# Patient Record
Sex: Male | Born: 1990 | Race: White | Hispanic: No | Marital: Single
Health system: Southern US, Community
[De-identification: ages and names within clinical notes are randomized; demographics above are authoritative.]

---
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
## 2010-01-03 ENCOUNTER — Emergency Department (HOSPITAL_COMMUNITY): Admission: EM | Admit: 2010-01-03 | Discharge: 2010-01-03 | Payer: Self-pay | Admitting: Emergency Medicine

## 2010-03-04 ENCOUNTER — Encounter: Payer: Self-pay | Admitting: Specialist

## 2010-03-17 ENCOUNTER — Encounter: Payer: Self-pay | Admitting: Specialist

## 2010-05-18 LAB — URINALYSIS, ROUTINE W REFLEX MICROSCOPIC
Bilirubin Urine: NEGATIVE
Glucose, UA: NEGATIVE mg/dL
Ketones, ur: NEGATIVE mg/dL
Nitrite: NEGATIVE
Protein, ur: NEGATIVE mg/dL

## 2010-05-19 LAB — COMPREHENSIVE METABOLIC PANEL
AST: 67 U/L — ABNORMAL HIGH (ref 0–37)
BUN: 15 mg/dL (ref 6–23)
CO2: 28 mEq/L (ref 19–32)
Calcium: 9.6 mg/dL (ref 8.4–10.5)
Creatinine, Ser: 1.05 mg/dL (ref 0.4–1.5)
GFR calc Af Amer: >60 mL/min (ref >60)
GFR calc non Af Amer: >60 mL/min (ref >60)
Total Bilirubin: 1 mg/dL (ref 0.3–1.2)

## 2010-05-19 LAB — URINALYSIS, ROUTINE W REFLEX MICROSCOPIC
Glucose, UA: NEGATIVE mg/dL
Nitrite: NEGATIVE
Specific Gravity, Urine: 1.023 (ref 1.005–1.030)
pH: 6 (ref 5.0–8.0)

## 2010-05-19 LAB — CBC
HCT: 48.9 % (ref 39.0–52.0)
MCHC: 34.1 g/dL (ref 30.0–36.0)
MCV: 96.3 fL (ref 78.0–100.0)
RBC: 5.08 MIL/uL (ref 4.22–5.81)

## 2010-05-19 LAB — DIFFERENTIAL
Basophils Absolute: 0 10*3/uL (ref 0.0–0.1)
Eosinophils Relative: 1 % (ref 0–5)
Lymphocytes Relative: 23 % (ref 12–46)
Lymphs Abs: 1.5 10*3/uL (ref 0.7–4.0)
Neutro Abs: 4.3 10*3/uL (ref 1.7–7.7)
Neutrophils Relative %: 66 % (ref 43–77)

## 2010-11-28 IMAGING — US US ABDOMEN COMPLETE
1 series · 14 of 25 positions shown · non-contrast
Comparison: CT dated 01/17/2005.

CLINICAL DATA: Abdominal pain.

ABDOMINAL ULTRASOUND COMPLETE

[Series 1: us abdomen complete · 0.30mm/px · 14 of 54 slices shown]
[im 1/54]
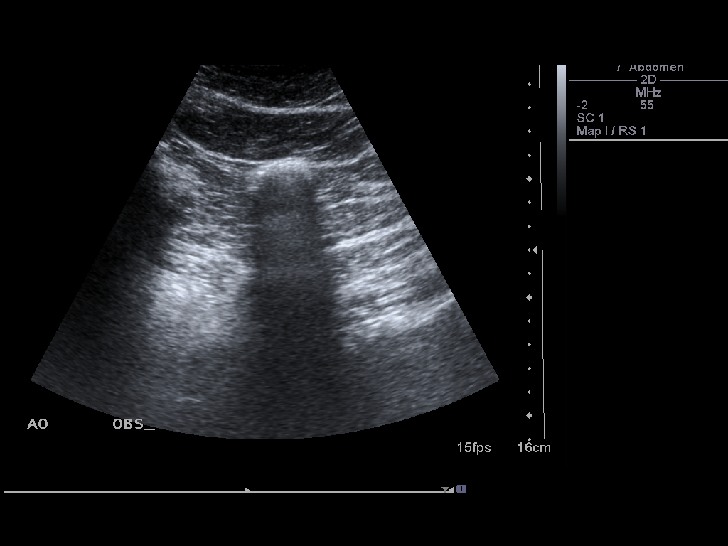
[im 5/54]
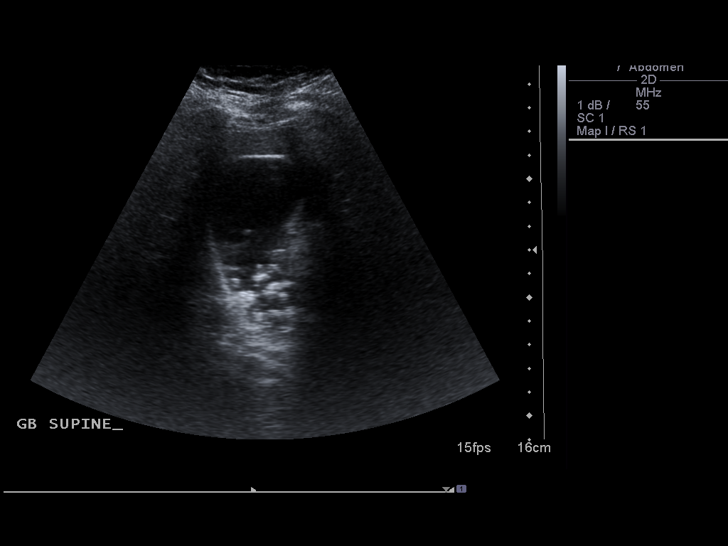
[im 9/54]
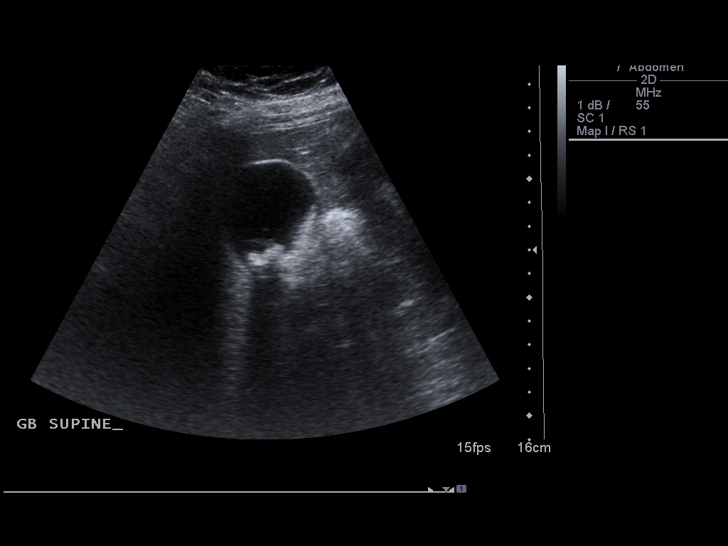
[im 14/54]
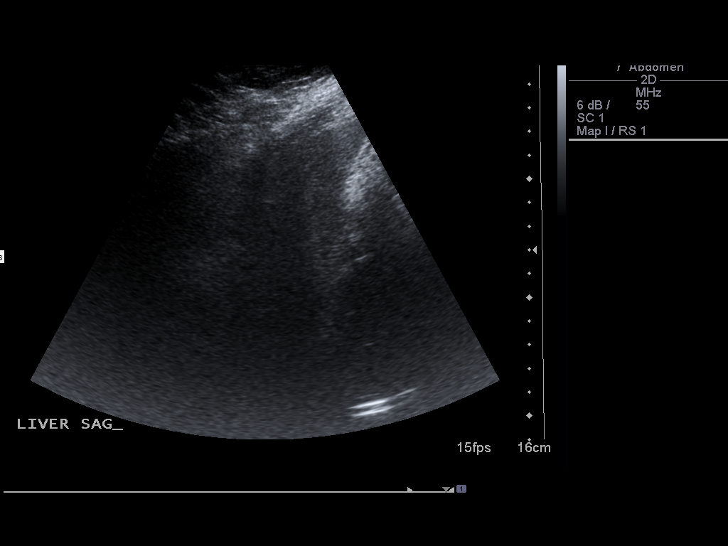
[im 18/54]
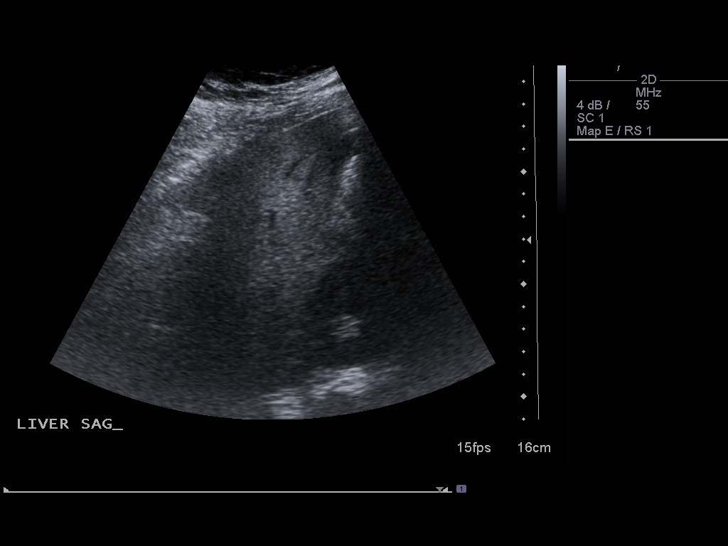
[im 20/54]
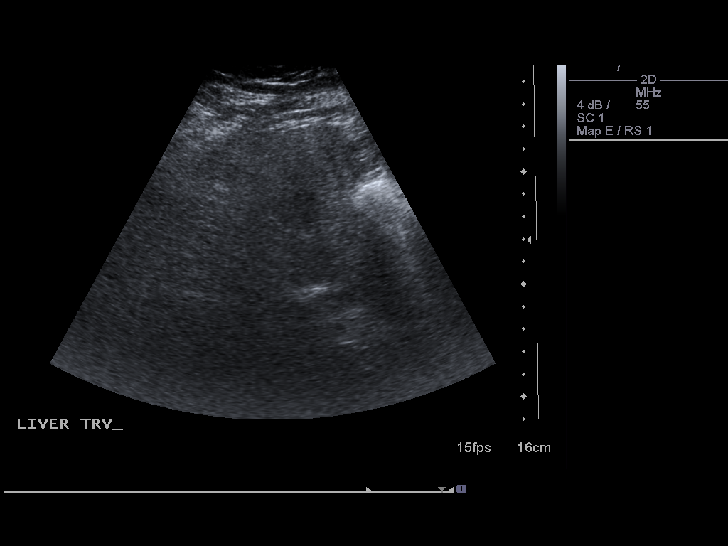
[im 25/54]
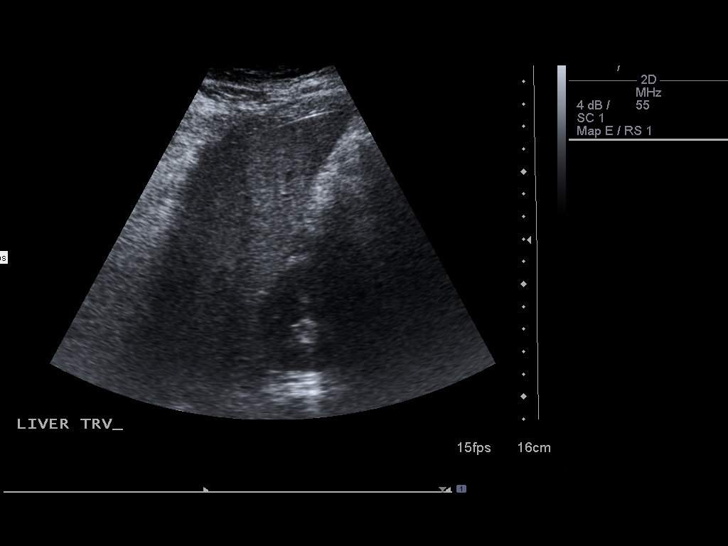
[im 29/54]
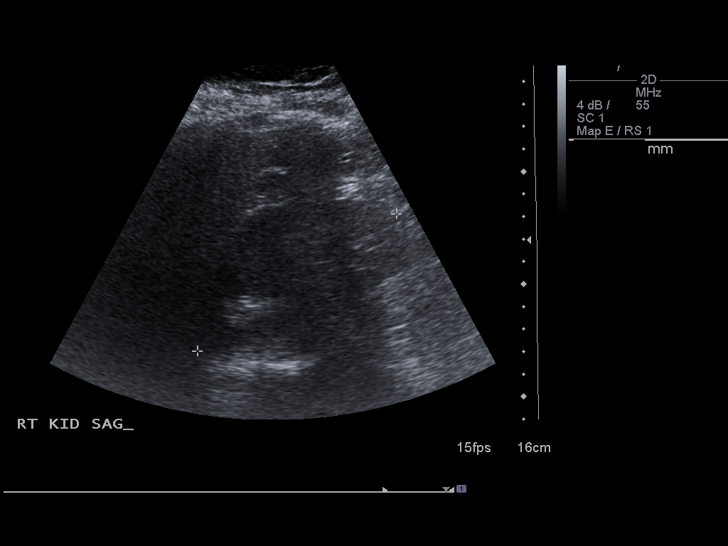
[im 34/54]
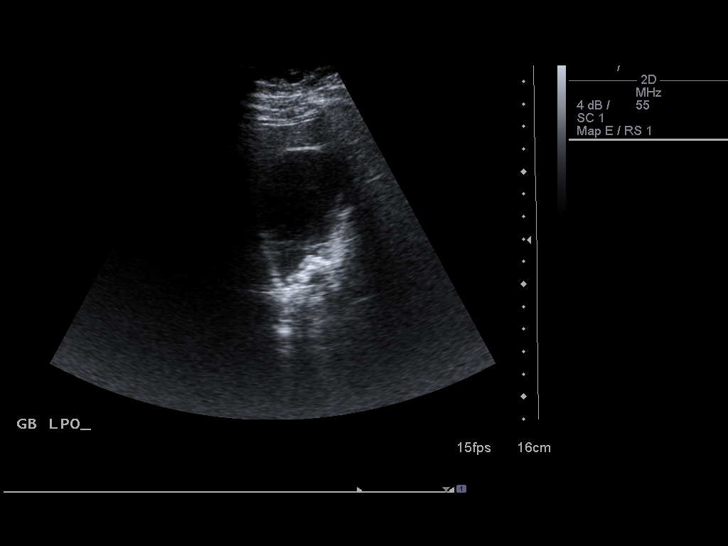
[im 36/54]
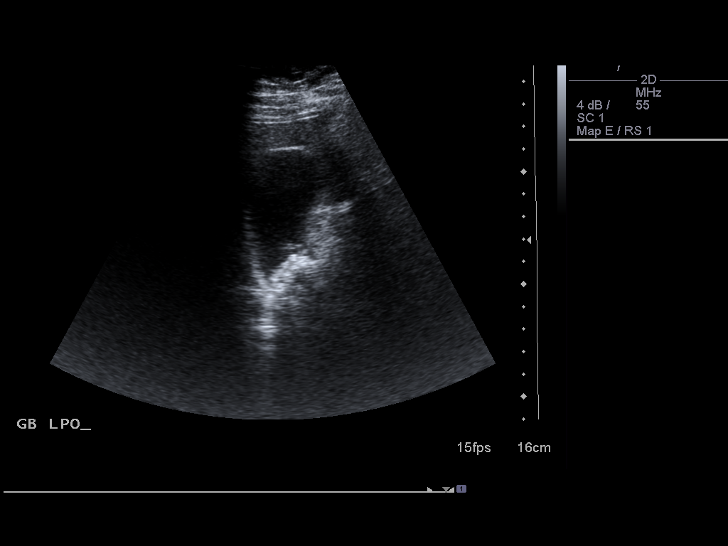
[im 40/54]
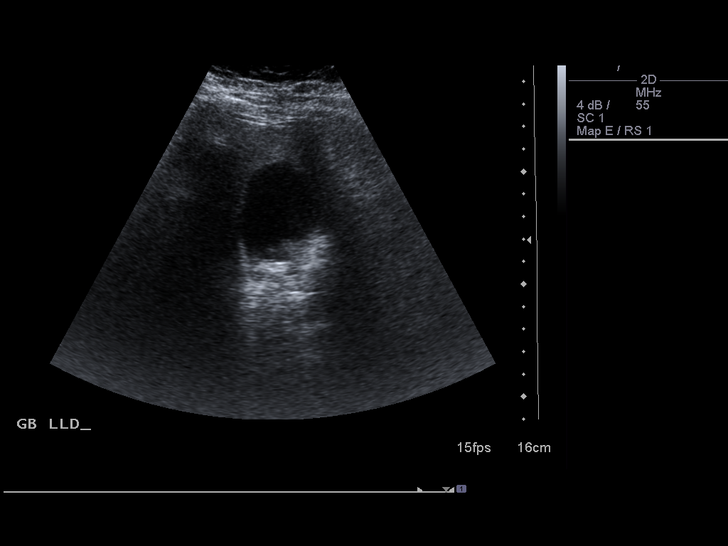
[im 45/54]
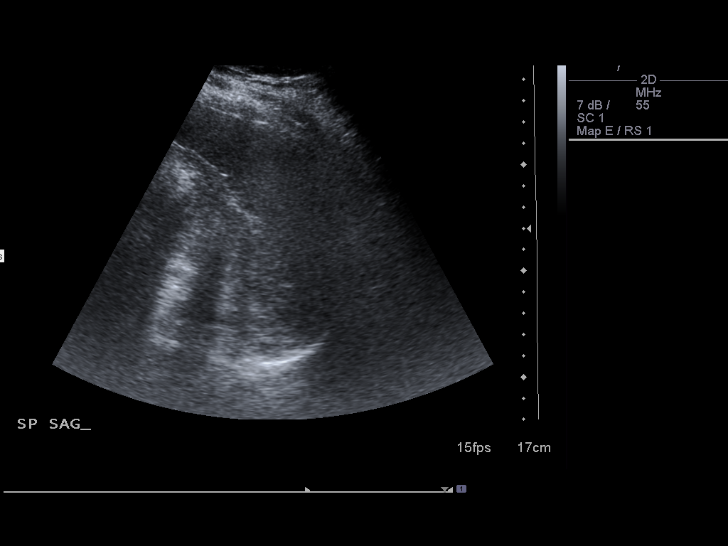
[im 49/54]
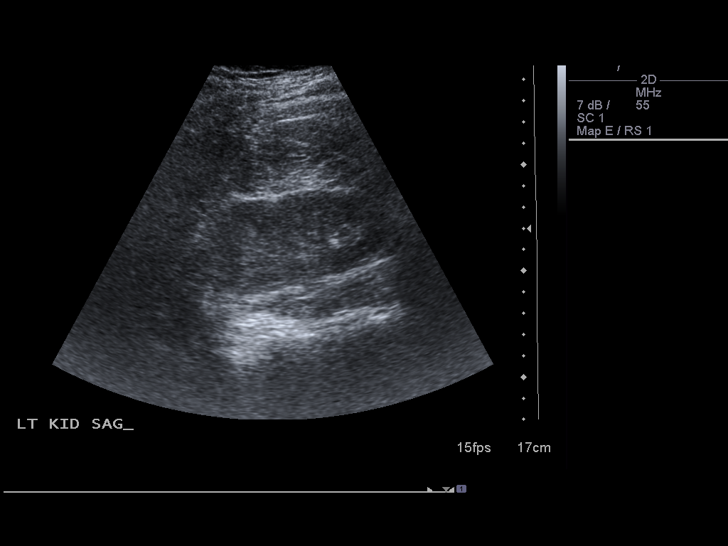
[im 54/54]
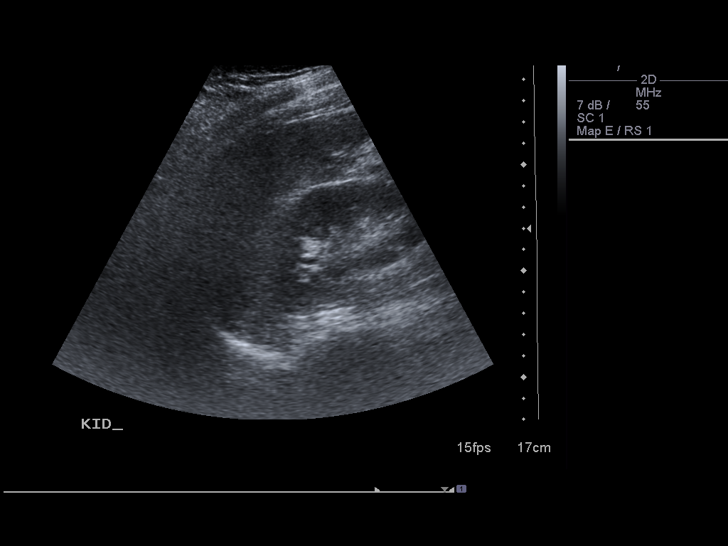

[14 of 25 positions shown; findings below may reference images not displayed]

FINDINGS: Gallbladder:  Multiple gallstones, the largest measuring 1.1 cm in
maximum diameter.  No gallbladder wall thickening or
pericholecystic fluid.  The patient was not tender over the
gallbladder.

Common bile duct: Normal diameter, measuring 3.8mm.

Liver:  Diffusely echogenic, corresponding to fatty infiltration on
the previous CT.

Inferior vena cava:  The visualized portion appears normal.

Pancreas:  Not visualized due to overlying bowel gas.

Spleen:  Normal.

Right kidney:  Normal.

Left kidney:  Normal.

Abdominal aorta:  Not visualized proximally due to overlying bowel
gas.  The visualized portions are normal in caliber without
aneurysm.

Other:  No free peritoneal fluid.
IMPRESSION: 1.  Cholelithiasis without evidence of cholecystitis.
2.  Diffuse fatty infiltration of the liver.
3.  Non-visualized pancreas and proximal abdominal aorta due to
overlying bowel gas.

## 2011-02-11 ENCOUNTER — Inpatient Hospital Stay: Payer: Self-pay | Admitting: Internal Medicine

## 2012-02-25 ENCOUNTER — Emergency Department: Payer: Self-pay | Admitting: Emergency Medicine

## 2012-07-07 ENCOUNTER — Emergency Department: Payer: Self-pay | Admitting: Emergency Medicine

## 2012-07-07 LAB — COMPREHENSIVE METABOLIC PANEL
Albumin: 4 g/dL (ref 3.4–5.0)
Alkaline Phosphatase: 85 U/L (ref 50–136)
Calcium, Total: 9 mg/dL (ref 8.5–10.1)
EGFR (African American): >60
Osmolality: 277 (ref 275–301)
SGPT (ALT): 137 U/L — ABNORMAL HIGH (ref 12–78)
Sodium: 139 mmol/L (ref 136–145)
Total Protein: 7.8 g/dL (ref 6.4–8.2)

## 2012-07-07 LAB — URINALYSIS, COMPLETE
Bacteria: NONE SEEN
Bilirubin,UR: NEGATIVE
Glucose,UR: NEGATIVE mg/dL (ref 0–75)
Ketone: NEGATIVE
Nitrite: NEGATIVE
Protein: 30
RBC,UR: 4 /HPF (ref 0–5)
Squamous Epithelial: NONE SEEN

## 2012-07-08 LAB — CBC
MCH: 33.1 pg (ref 26.0–34.0)
MCHC: 35.5 g/dL (ref 32.0–36.0)
Platelet: 254 10*3/uL (ref 150–440)
RDW: 13.9 % (ref 11.5–14.5)
WBC: 9.2 10*3/uL (ref 3.8–10.6)

## 2013-04-02 IMAGING — CT CT CHEST W/ CM
1 series · 15 of 31 positions shown, 19 images · IV contrast (APPLIED)
Comparison: none

REASON FOR EXAM: dyspnea  PO2 56, O2 stat 86%    [HOSPITAL]
COMMENTS:   LMP: (Male)

PROCEDURE:     CT  - CT CHEST (FOR PE) W  - February 11, 2011  [DATE]
RESULT:
TECHNIQUE: Helical 3 mm sections were obtained from the thoracic inlet
through the lung bases status post intravenous administration of 100 mL of
[id number].

[Series 4: soft tissue · axial · 0.80mm/px · z∈[+77,+305]mm · 15 of 84 slices shown, 19 images]
[im 4/84  mediastinal]
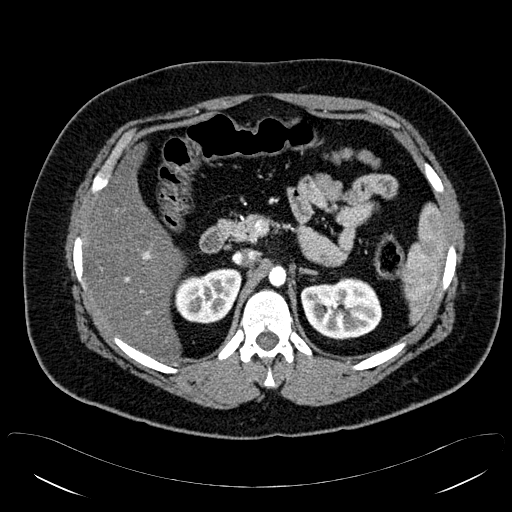
[im 4/84  lung]
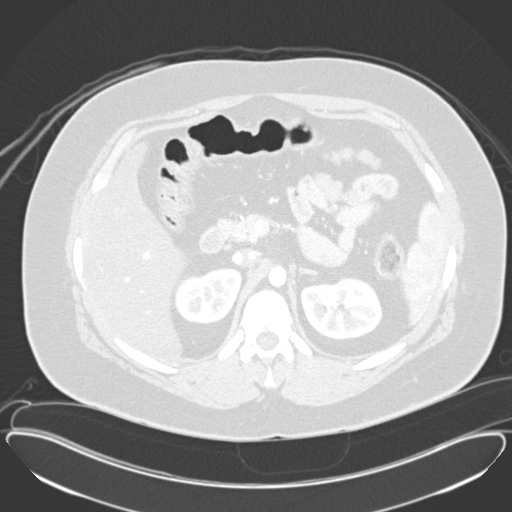
[im 10/84  lung]
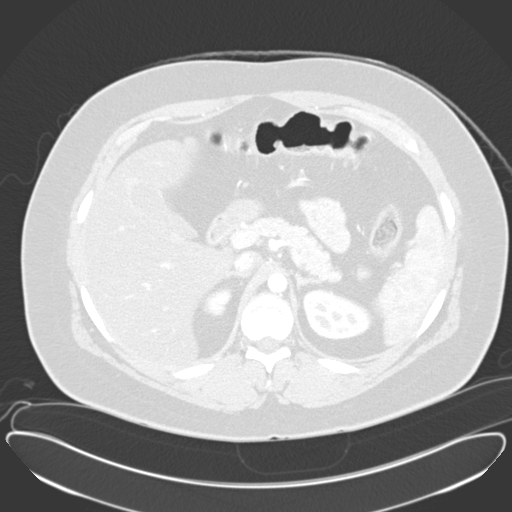
[im 16/84  lung]
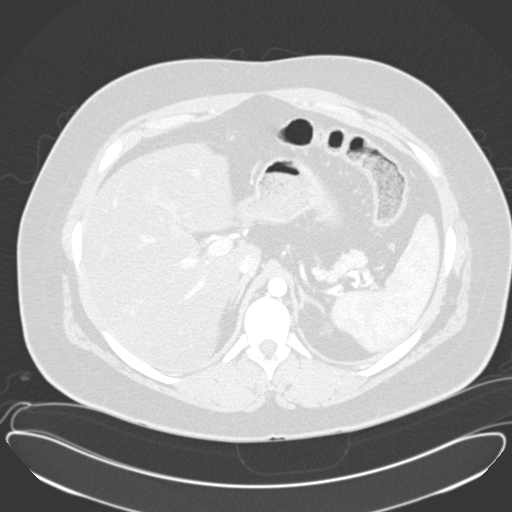
[im 19/84  lung]
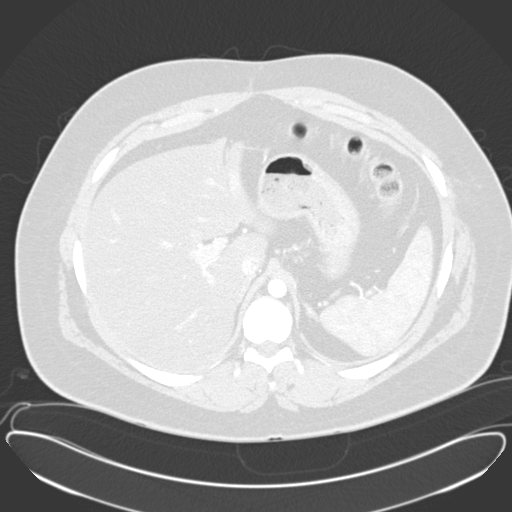
[im 25/84  mediastinal]
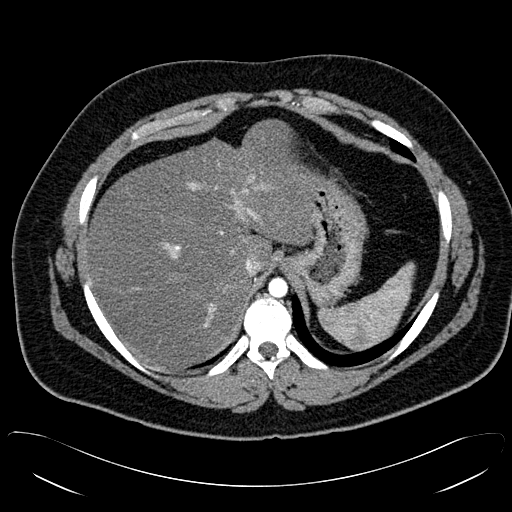
[im 25/84  lung]
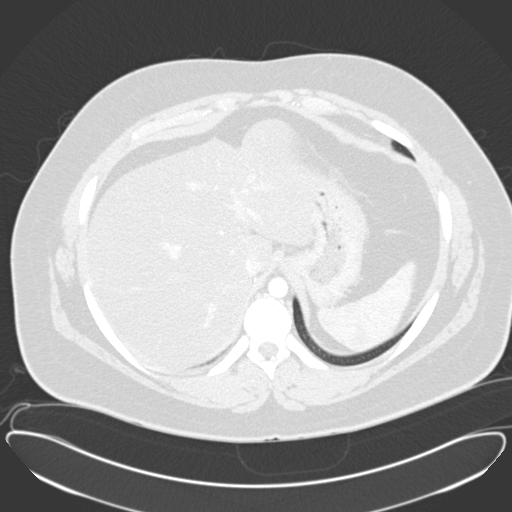
[im 31/84  lung]
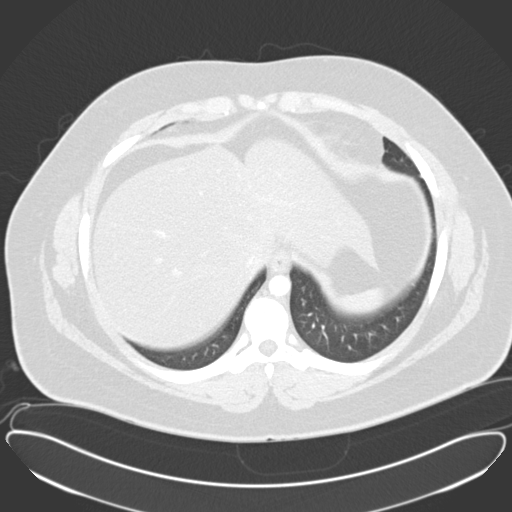
[im 37/84  lung]
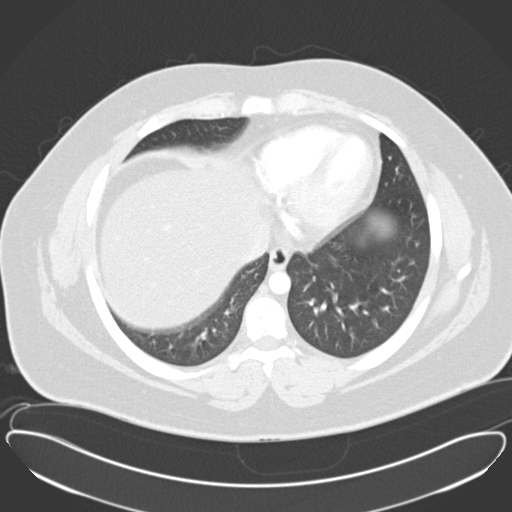
[im 44/84  lung]
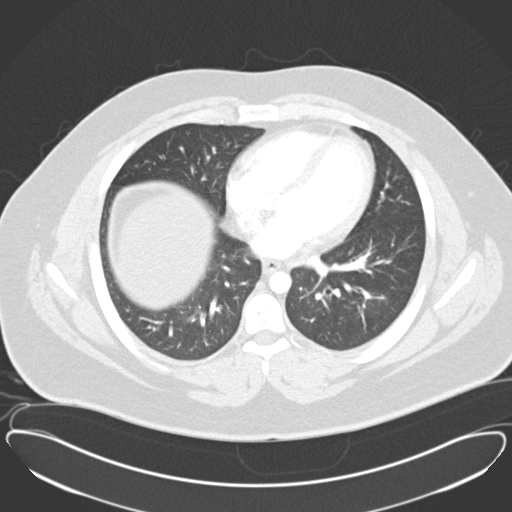
[im 47/84  mediastinal]
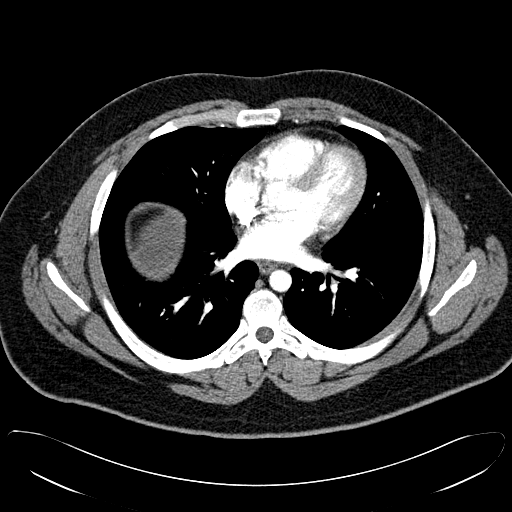
[im 47/84  lung]
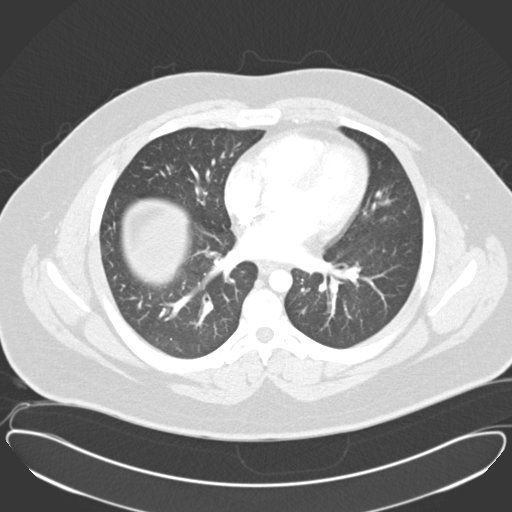
[im 53/84  lung]
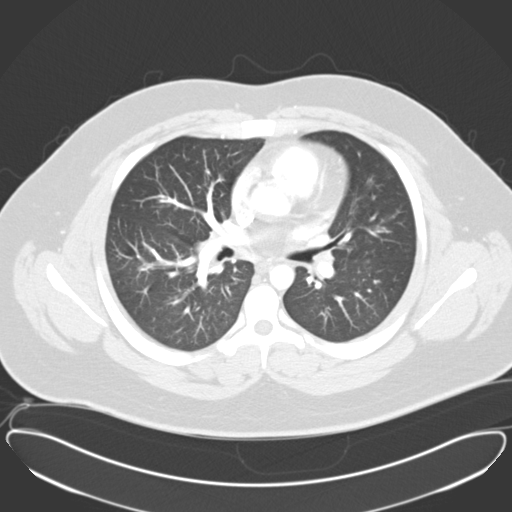
[im 59/84  lung]
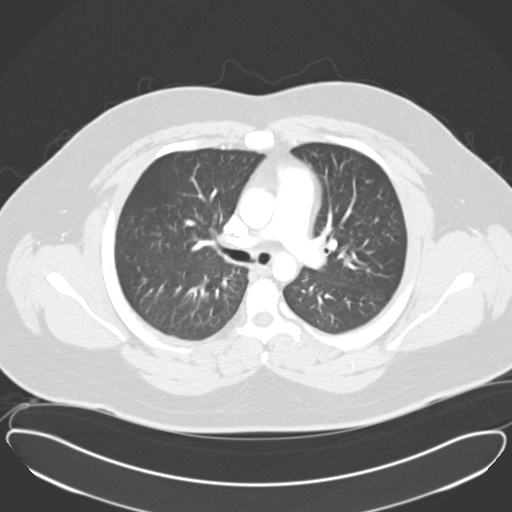
[im 65/84  lung]
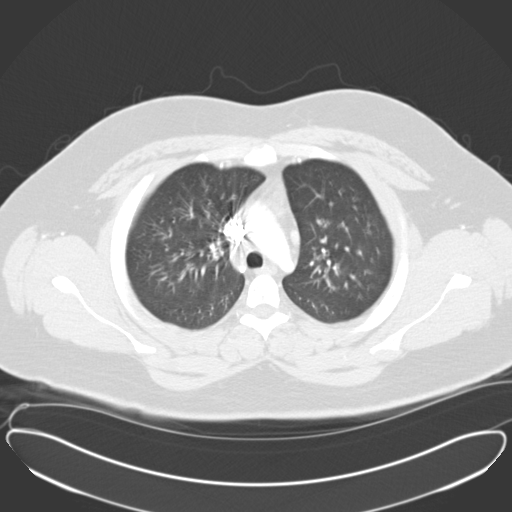
[im 68/84  mediastinal]
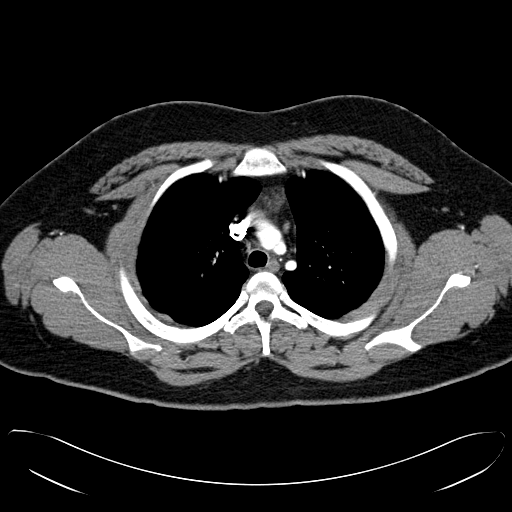
[im 68/84  lung]
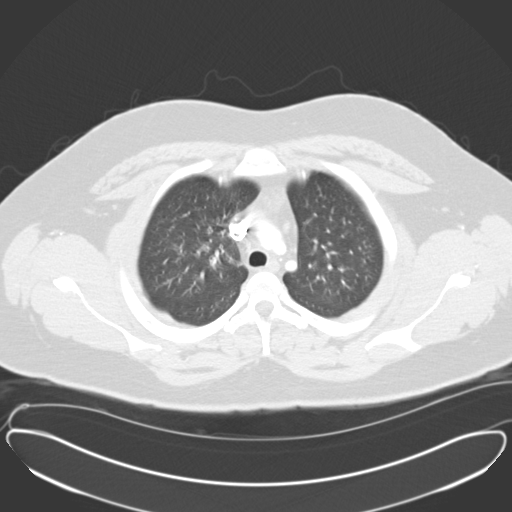
[im 74/84  lung]
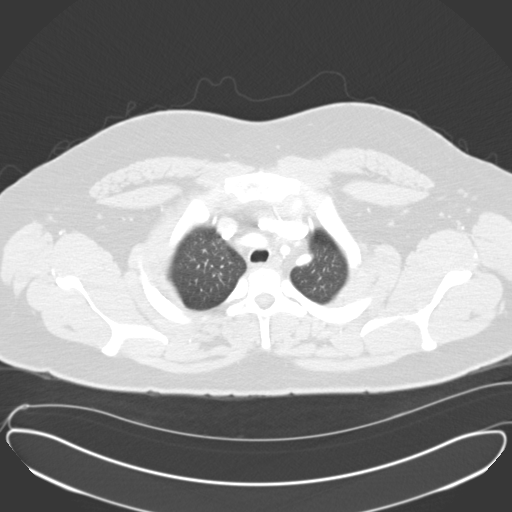
[im 80/84  lung]
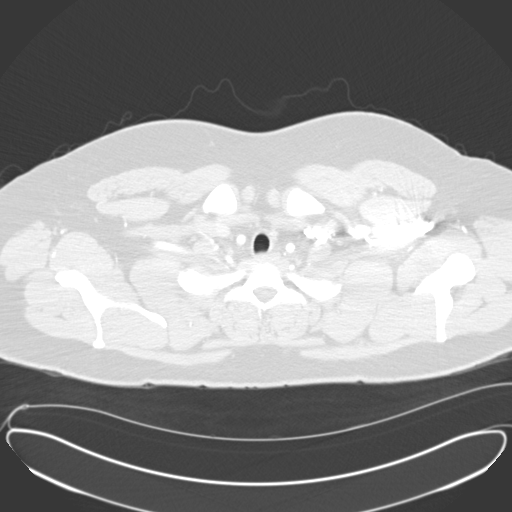

[15 of 31 positions shown; findings below may reference images not displayed]

FINDINGS: Evaluation of the mediastinal, hilar regions and structures
demonstrates prominent lymph nodes in the mediastinum and hilar regions.
There is no evidence of mediastinal or hilar masses. There is no evidence of
filling defects within the main, lobar or segmental pulmonary arteries. The
patient has taken a shallow inspiration. No focal regions of consolidation
or focal infiltrates are appreciated. The visualized upper abdominal viscera
demonstrate diffuse low-attenuation within the liver. The visualized portion
of the liver is otherwise unremarkable. The remaining visualized upper
abdominal viscera are unremarkable.
IMPRESSION: 1. Prominent mediastinal and hilar lymph nodes.
2. Hepatic steatosis.
3. No CT evidence of pulmonary arterial embolic disease.
4. Ferienhaus Erxleben, PA of the Emergency Department was informed of these
findings via preliminary faxed report.

## 2014-06-05 NOTE — Discharge Summary (Signed)
PATIENT NAME:  John Booth, John Booth MR#:  [id number] DATE OF BIRTH:  [birth year]  DATE OF ADMISSION:  02/11/2011 DATE OF DISCHARGE:  02/12/2011  PRESENTING COMPLAINT: Cough and shortness of breath.   DISCHARGE DIAGNOSES:  1. Acute bronchitis.  2. Hypokalemia, resolved. Saturations on room air 92%.   DISCHARGE MEDICATIONS: 1. Xanax 0.5 mg p.o. daily at bedtime.  2. Levaquin 750 mg p.o. daily for four days.  3. Tessalon Perles 100 mg q.i.d. p.r.n.   DIET: Regular.   FOLLOW-UP: Follow-up with Dr. Loma Senderharles Phillips in 1 to 2 weeks.   LABS ON DISCHARGE: CBC within normal limits. Basic metabolic panel within normal limits. Troponin 0.02. Angiotensin-converting enzyme is within normal limits, 52 units/L.   CT of the chest reveals prominent mediastinal hilar lymph nodes. Hepatic steatosis. No CT evidence of pulmonary arterial embolic disease.   Influenza A+B is negative.   BRIEF SUMMARY OF HOSPITAL COURSE: John Booth is a 24 year old Caucasian gentleman who presents to the Emergency Room with cough and congestion. He was treated as an outpatient with Zithromax, however he continued to worsen. He presented to the Emergency Room and was admitted with acute hypoxic respiratory failure secondary to acute bronchitis. CT of the chest showed prominent lymphadenopathy, which appeared to be reactive due to bronchitis. His angiotensin-converting enzyme levels were negative. The patient was continued on Levaquin. His hypoxia resolved. His saturations were 92% on room air. He will follow-up as an outpatient with Dr. Dr. Loma Senderharles Phillips, his primary care, to ensure resolution of bronchitis and also follow-up x-ray to make sure the patient'Booth lymphadenopathy has resolved as well.   Nausea likely from headache. Zofran p.r.n. was given.   History of medulloblastoma status post surgery several years ago, stable.   Anxiety on Xanax.  Hospital stay otherwise remained stable.   CODE STATUS: The patient remained a  FULL CODE.   TIME SPENT: 40 minutes.  ____________________________ Wylie HailSona A. Allena KatzPatel, MD sap:rbg D: 02/12/2011 16:02:30 ET T: 02/14/2011 12:47:49 ET JOB#: [id number]  cc: John Booth A. Allena KatzPatel, MD, <Dictator> Marcine Matarharles W. Phillips Jr., MD Willow OraSONA A Xavi Tomasik MD ELECTRONICALLY SIGNED 02/14/2011 14:44

## 2014-06-05 NOTE — H&P (Signed)
PATIENT NAME:  John Booth, John Booth MR#:  [id number] DATE OF BIRTH:  May 26, 1990  DATE OF ADMISSION:  02/10/2011  PRIMARY CARE PHYSICIAN: Dr. Loma Sender   CHIEF COMPLAINT: Increased cough and shortness of breath.   HISTORY OF PRESENT ILLNESS: John Booth is a 24 year old Caucasian Booth who was in John Booth usual state of health until Wednesday when John Booth reported John Booth got sick with cough, clear sputum whitish in color, and aching pains. John Booth was treated as an outpatient with Zithromax. However, John Booth symptoms progressed and John Booth became more short of breath. The patient came to the Emergency Department seeking medical attention. John Booth denies having fever but John Booth had chills. No chest pain, but sore in John Booth chest with the cough. Evaluation here in the Emergency Department reveals oxygen desaturation. John Booth was admitted for further evaluation and treatment.   REVIEW OF SYSTEMS: The patient denies having fever. Mild chills. No night sweats. John Booth admits having mild fatigue. EYES: No blurring of vision. No double vision. ENT: No hearing impairment. No sore throat. No dysphagia. CARDIOVASCULAR: No chest pain. Admits having shortness of breath. No peripheral edema. No syncope. RESPIRATORY: Admits having cough with whitish sputum. John Booth became short of breath.  GASTROINTESTINAL: Denies any vomiting. No abdominal pain. No diarrhea. No melena. GENITOURINARY: Denies any dysuria or frequency of urination. MUSCULOSKELETAL: No joint pain or swelling. No muscular pain or swelling. INTEGUMENT: No skin rash. No ulcers. NEUROLOGIC: No focal motor weakness. No seizure activity. No headache. PSYCHIATRY: No anxiety. No depression. However, John Booth takes Xanax to help sleep at night. ENDOCRINE: No heat or cold intolerance. No night sweats.   PAST MEDICAL HISTORY: History of brain cancer, operated on at [facility] in 2006. John Booth is in remission.   SOCIAL HABITS: Nonsmoker never smoked. No history of alcohol or drug abuse.   SOCIAL HISTORY: John Booth is unemployed and living  on disability. John Booth lives at home with John Booth father.   FAMILY HISTORY: Negative for cancer. Negative for lung problems.   ADMISSION MEDICATIONS: Xanax 0.5 mg at night p.r.n.   ALLERGIES: No known drug allergies.   PHYSICAL EXAMINATION:  VITAL SIGNS: Blood pressure 127/70, respiratory rate 24, pulse 112, temperature 96.5, oxygen saturation 86% on room air.   GENERAL APPEARANCE: John Booth lying in bed, covered himself. John Booth is sick-looking. John Booth looks congested.   HEAD/NECK: No pallor. No icterus. No cyanosis.   ENT: Hearing was normal. Nasal mucosa, lips, tongue were normal.   EYES: Normal eyelids. Both conjunctivae were slightly congested. Pupils about 5 mm, equal and reactive to light.   NECK: Supple. Trachea at midline. No thyromegaly. No cervical lymphadenopathy.   HEART: Normal S1, S2. No S3, S4. No murmur. No gallop. Distant heart sounds.   RESPIRATORY: No rales. Few expiratory wheezes. John Booth is not using accessory muscles for breathing.   ABDOMEN: Morbidly obese, soft without tenderness. No hepatosplenomegaly. No masses. No hernias. There are extensive striae on both sides of the abdomen.   MUSCULOSKELETAL: No joint swelling. No clubbing.   SKIN: No ulcers. No subcutaneous nodules.   NEUROLOGIC: Cranial nerves II through XII were intact. No focal motor deficit. Coordination movements were normal.   PSYCHIATRY: The patient is alert and oriented times three. Mood and affect were normal.   LABORATORY, DIAGNOSTIC, AND RADIOLOGICAL DATA: Arterial blood gas on room air showed a pH of 7.4, pCO2 39, pO2 56. Influenza test was negative. CBC showed white count 6500, hemoglobin 15, hematocrit 44, platelet count 165. Serum glucose 97, BUN 13, creatinine 1.3, sodium 140, potassium  3.9. Chest x-ray showed heart size was normal. No consolidation, no effusion. There is one linear density at the left lower lung field. This could be consolidation versus blood vessel. This is also seen in the lateral  chest x-ray view. CT scan of the chest or CTA was negative for pulmonary embolism.   ASSESSMENT:  1. Dyspnea associated with oxygen desaturation and hypoxemia. It is difficult to argue that this finding of desaturation is secondary to upper respiratory tract infection. Likely John Booth has probably early pneumonia that is not very evident by the chest x-ray.  2. Tachycardia. 3. Obesity.  4. Past history of brain cancer.   PLAN: Admit the patient to the medical floor. Start intravenous Levaquin along with Duo-Neb nebulization. Symptomatic treatment of John Booth cough and congestion with Robitussin-DM. We will monitor John Booth symptoms. John Booth may require repeat chest x-ray if symptoms worsen. Oxygen supplementation. Tylenol p.r.n. For deep vein thrombosis prophylaxis, I will place him on Lovenox 40 mg subcutaneous daily since John Booth is sick and lethargic. For peptic ulcer disease prophylaxis, I will place him on Protonix 40 mg a day.     ____________________________ Carney CornersAmir M. Rudene Rearwish, MD amd:bjt D: 02/11/2011 03:16:08 ET T: 02/11/2011 09:14:43 ET JOB#: [id number]  cc: Carney CornersAmir M. Rudene Rearwish, MD, <Dictator> Marcine Matarharles W. Phillips Jr., MD Karolee OhsAMIR Dala DockM Nyzir Dubois MD ELECTRONICALLY SIGNED 02/12/2011 4:10

## 2019-11-03 DIAGNOSIS — R9431 Abnormal electrocardiogram [ECG] [EKG]: Secondary | ICD-10-CM
